# Patient Record
Sex: Female | Born: 1990 | Race: Black or African American | Hispanic: No | Marital: Single | Smoking: Never smoker
Health system: Southern US, Community
[De-identification: ages and names within clinical notes are randomized; demographics above are authoritative.]

---

## 2015-01-23 ENCOUNTER — Emergency Department (HOSPITAL_COMMUNITY): Payer: Federal, State, Local not specified - PPO

## 2015-01-23 ENCOUNTER — Emergency Department (HOSPITAL_COMMUNITY)
Admission: EM | Admit: 2015-01-23 | Discharge: 2015-01-23 | Disposition: A | Discharge status: Home / Self Care | Payer: Federal, State, Local not specified - PPO | Attending: Emergency Medicine | Admitting: Emergency Medicine

## 2015-01-23 ENCOUNTER — Encounter (HOSPITAL_COMMUNITY): Payer: Self-pay | Admitting: Emergency Medicine

## 2015-01-23 DIAGNOSIS — Z880 Allergy status to penicillin: Secondary | ICD-10-CM | POA: Diagnosis not present

## 2015-01-23 DIAGNOSIS — Y9389 Activity, other specified: Secondary | ICD-10-CM | POA: Diagnosis not present

## 2015-01-23 DIAGNOSIS — Y9289 Other specified places as the place of occurrence of the external cause: Secondary | ICD-10-CM | POA: Diagnosis not present

## 2015-01-23 DIAGNOSIS — G8929 Other chronic pain: Secondary | ICD-10-CM | POA: Insufficient documentation

## 2015-01-23 DIAGNOSIS — S9031XA Contusion of right foot, initial encounter: Secondary | ICD-10-CM | POA: Diagnosis not present

## 2015-01-23 DIAGNOSIS — Y998 Other external cause status: Secondary | ICD-10-CM | POA: Diagnosis not present

## 2015-01-23 DIAGNOSIS — S99921A Unspecified injury of right foot, initial encounter: Secondary | ICD-10-CM | POA: Diagnosis present

## 2015-01-23 DIAGNOSIS — W2209XA Striking against other stationary object, initial encounter: Secondary | ICD-10-CM | POA: Diagnosis not present

## 2015-01-23 MED ORDER — NAPROXEN 250 MG PO TABS
500.0000 mg | ORAL_TABLET | Freq: Once | ORAL | Status: AC
  Administered 2015-01-23: 500 mg via ORAL
  Filled 2015-01-23: qty 2

## 2015-01-23 NOTE — Discharge Instructions (Signed)
Rest, Ice intermittently (in the first 24-48 hours), and elevate (Limb above the level of the heart)   You can take your standard dose of naproxen or alternatively you can take up to  of ibuprofen (that is usually 4 over the counter pills)  3 times a day for 5 days. Take with food.    Do not hesitate to return to the emergency room for any new, worsening or concerning symptoms.  Please obtain primary care using resource guide below. Let them know that you were seen in the emergency room and that they will need to obtain records for further outpatient management.   Contusion A contusion is a deep bruise. Contusions are the result of an injury that caused bleeding under the skin. The contusion may turn blue, purple, or yellow. Minor injuries will give you a painless contusion, but more severe contusions may stay painful and swollen for a few weeks.  CAUSES  A contusion is usually caused by a blow, trauma, or direct force to an area of the body. SYMPTOMS   Swelling and redness of the injured area.  Bruising of the injured area.  Tenderness and soreness of the injured area.  Pain. DIAGNOSIS  The diagnosis can be made by taking a history and physical exam. An X-ray, CT scan, or MRI may be needed to determine if there were any associated injuries, such as fractures. TREATMENT  Specific treatment will depend on what area of the body was injured. In general, the best treatment for a contusion is resting, icing, elevating, and applying cold compresses to the injured area. Over-the-counter medicines may also be recommended for pain control. Ask your caregiver what the best treatment is for your contusion. HOME CARE INSTRUCTIONS   Put ice on the injured area.  Put ice in a plastic bag.  Place a towel between your skin and the bag.  Leave the ice on for 15-20 minutes, 3-4 times a day, or as directed by your health care provider.  Only take over-the-counter or prescription medicines for  pain, discomfort, or fever as directed by your caregiver. Your caregiver may recommend avoiding anti-inflammatory medicines (aspirin, ibuprofen, and naproxen) for 48 hours because these medicines may increase bruising.  Rest the injured area.  If possible, elevate the injured area to reduce swelling. SEEK IMMEDIATE MEDICAL CARE IF:   You have increased bruising or swelling.  You have pain that is getting worse.  Your swelling or pain is not relieved with medicines. MAKE SURE YOU:   Understand these instructions.  Will watch your condition.  Will get help right away if you are not doing well or get worse. Document Released: 02/25/2005 Document Revised: 05/23/2013 Document Reviewed: 03/23/2011 [facility] Patient Information 2015 [place], [place]. This information is not intended to replace advice given to you by your health care provider. Make sure you discuss any questions you have with your health care provider.   Emergency Department Resource Guide 1) Find a Doctor and Pay Out of Pocket Although you won't have to find out who is covered by your insurance plan, it is a good idea to ask around and get recommendations. You will then need to call the office and see if the doctor you have chosen will accept you as a new patient and what types of options they offer for patients who are self-pay. Some doctors offer discounts or will set up payment plans for their patients who do not have insurance, but you will need to ask so you aren't surprised  when you get to your appointment.  2) Contact Your Local Health Department Not all health departments have doctors that can see patients for sick visits, but many do, so it is worth a call to see if yours does. If you don't know where your local health department is, you can check in your phone book. The CDC also has a tool to help you locate your state's health department, and many state websites also have listings of all of their local health  departments.  3) Find a Walk-in Clinic If your illness is not likely to be very severe or complicated, you may want to try a walk in clinic. These are popping up all over the country in pharmacies, drugstores, and shopping centers. They're usually staffed by nurse practitioners or physician assistants that have been trained to treat common illnesses and complaints. They're usually fairly quick and inexpensive. However, if you have serious medical issues or chronic medical problems, these are probably not your best option.  No Primary Care Doctor: - Call Health Connect at  [phone] - they can help you locate a primary care doctor that  accepts your insurance, provides certain services, etc. - Physician Referral Service- [phone]  Chronic Pain Problems: Organization         Address  Phone   Notes  [facility]  [phone] Patients need to be referred by their primary care doctor.   Medication Assistance: Organization         Address  Phone   Notes  [facility] Medication [facility] [address] [place], [place] [address] [phone] --Must be a resident of [facility] [place] -- Must have NO insurance coverage whatsoever (no Medicaid/ Medicare, etc.) -- The pt. MUST have a primary care doctor that directs their care regularly and follows them in the community   MedAssist  [phone]   [employer]  [phone]    Agencies that provide inexpensive medical care: Organization         Address  Phone   Notes  [facility]  [phone]   [facility]    [phone]   [facility] [address] [place], [place] [address] [phone]   [facility] [address], [place] [phone]   Planned Parenthood    [phone]   [facility]    [phone]   [facility]  [address], [place] Phone:  [phone])  [phone], Fax:  [phone] Hours of Operation:  9 am - 6 pm, M-F.  Also accepts Medicaid/Medicare and self-pay.  [facility]  [address], [place] Phone: [phone], Fax: [phone]. Hours of Operation:  8:30 am - 5:30 pm, M-F.  Also accepts Medicaid and self-pay.  [facility] [place] [address], [place] Phone: [phone]   [facility] [address], [place], [place] [phone], Ext. [phone] Mondays & Thursdays: 7-9 AM.  First 15 patients are seen on a first come, first serve basis.    Medicaid-accepting [facility] Providers:  Organization         Address  Phone   Notes  [facility] [address],  [phone] Also accepts self-pay patients.  [facility] [address], [place]  [phone]   [facility] [address] [place], [address], [place] [phone]   [facility] [address], [place] [phone]   Renaye Rakers [address], [place]   [phone] Only accepts [place] Access [place] patients after they have their name applied to their card.   Self-Pay (no insurance) in [facility]:  Organization         Address  Phone   Notes  Sickle Cell Patients, [facility] [address] [place], [place] [phone]   [facility] [address] [place], [place] [phone]   [facility] [place]  [address], [place] [phone]   [facility]/Dr. Osei-Bonsu  [address], [place] or [address], [place] [phone] Phone number for both [place] and [place] locations is the same.  [facility] [address], [place] [phone]   [facility] [address], [place] or [address] [phone]   [facility] [address], [place] [phone], phone; [phone], fax Sees patients 1st and 3rd Saturday of every month.  Must not qualify for public or private insurance (i.e. Medicaid, Medicare, [place] Health Choice, Veterans' Benefits)  Household income should be no more than 200% of the poverty level The clinic cannot treat you if you are pregnant or think you are pregnant  Sexually transmitted diseases are not treated at the clinic.    Dental Care: Organization         Address  Phone  Notes  [facility] of [facility] [address] [place], [place] [phone] Accepts children up to age 62 who are enrolled in [place] or [place] Health Choice; pregnant women with a Medicaid card; and children who have applied for Medicaid or [place] Health Choice, but were declined, whose parents can pay a reduced fee at time of service.  [facility]  [address], [place] [phone] Accepts children up to age 4 who are enrolled in [place] or [place] Health Choice; pregnant women with a Medicaid card; and children who have applied for Medicaid or [place] Health Choice, but were declined, whose parents can pay a reduced fee at time of service.  [facility]  [address] [place], [place] [phone] Patients are seen by appointment only. Walk-ins are not accepted. [facility] will see patients 34 years of age and older. Monday - Tuesday (8am-5pm) Most Wednesdays (8:30-5pm) $30 per visit, cash only  [facility] Dental Access PRO[facility]  [address], [facility] [phone] Patients are seen by appointment only. Walk-ins are not accepted. [facility] will see patients 34 years of age and older. One Wednesday Evening (Monthly: Volunteer Based).  $30 per visit, cash only  [employer]  [phone] for adults;  Children under age 35, call Graduate Pediatric Dentistry at [phone]. Children aged 57-14, please call [phone] to request a pediatric application.  Dental services are provided in all areas of dental care including fillings, crowns and bridges, complete and partial dentures, implants, gum treatment, root canals, and extractions. Preventive care is also provided. Treatment is provided to both adults and children. Patients are selected via a lottery and there is often a waiting list.  [facility] [address] [place]  [phone] [email or web address]   [facility] [address] [place], [place] [phone], Ext. [phone] Second and Fourth Thursday of each month, opens at 6:30 AM; Clinic ends at 9 AM.  Patients are seen on a first-come first-served basis, and a limited number are seen during each clinic.   [facility]  [address] Hillard Danker [place], [place] [phone]   Eligibility Requirements You must have lived in [place], [place], or [place] counties for at least the last three months.   You cannot be eligible for state or federal sponsored [employer], including [employer], [place], or [employer].   You generally cannot be eligible for healthcare insurance through your employer.    How to apply: Eligibility screenings are held every Tuesday and Wednesday afternoon from 1:00 pm until 4:00 pm. You do not need an appointment for the interview!  [facility] [address], [place], [place]   [place]  [place] Department  Deputy  [phone]    Behavioral Health Resources in the Community: Intensive Outpatient Programs Organization         Address  Phone  Notes  Webb Dickens. [address] [place], [place] [phone]   [facility] Outpatient [address] [place], [place]   [facility]: [facility] [address] [place], [place]   [place] [address],  [place], [place] or [phone]   Substance Abuse Resources Organization         Address  Phone  Notes  Alcohol and Drug Services  [phone]   [place]  [phone]   The [place]   Chinita Pester  [phone]   Residential & Outpatient Substance Abuse Program  [phone]   Psychological Services Organization         Address  Phone  Notes  [facility]  [place]  [phone]   [place] [address], [place] or [phone]    Mobile Crisis Teams Organization         Address  Phone  Notes  [facility], Mobile Crisis Care Unit  [phone]   [facility]  [address] [place], [place]   [place] Levels [address], [place] [phone]    Self-Help/Support Groups Organization         Address  Phone             Notes  [place]. of [place] - variety of support groups  [place] Call for more information  Narcotics Anonymous (NA), [facility]aring [facility] [address], [employer] [place]  2 meetings at this location   Special educational needs teacher         Address  Phone  Notes  [facility]idential [facility] [place],    [place]  [phone]   [facility]  [address] [place] [address], [place], [place]   [place], [place] [phone] Admissions: 8am-3pm M-F  Incentives Substance [place] [address]    [place], [place] [phone]   [facility] [address] [place], [place], [place]   The [address]  [place], [place]   [facility] - Intensive  Outpatient [address], [place], [place]   [facility] ([place] [address]  [place], [place] [phone] or [phone]   [facility] ([facility]) [address], [place], [place] Accepts Medicaid  [facility] [place] [address],  [place] [phone] Substance Abuse/Addiction Treatment   [facility] Organization         Address  Phone  Notes  [facility]  [phone]   Domenic Schwab, PhD [address] Arlis Porta [place], [place]   [phone] or [phone]   [place], [place] [phone]   [facility] [address] [place], [place] [phone] Insurance/Medicaid/sponsorship through [facility] and Families [address]                                    [place], [place] [phone] [place] [address], [place] [phone]    Dr. Adele Schilder  [phone]   [facility] Dept. 1) [address], [place] 2) [place] 3)  [address], [place] [phone]  [phone]   [place] [phone] or [phone] (After Hours)

## 2015-01-23 NOTE — ED Notes (Signed)
Patient states stepped on the prongs of an adapter last night and bruised bottom of foot.   Patient states that she is unable to put a lot of weight on the foot.   R foot pain.  No break in skin.

## 2015-01-23 NOTE — ED Notes (Signed)
Patient remains in xray 

## 2015-01-23 NOTE — ED Provider Notes (Signed)
CSN: [id number]     Arrival date & time 01/23/15  4098 History   This patient was seen in room TR11C/TR11C and the patient's care was started at 10:39 AM.    Chief Complaint  Patient presents with  . Foot Injury     The history is provided by the patient. No language interpreter was used.    Blood pressure 103/62, pulse 71, temperature 98.4 F (36.9 C), temperature source Oral, resp. rate 12, height 5\' 1"  (1.549 m), weight 120 lb (54.432 kg), SpO2 100 %.  Annette Roy is a 24 y.o. female complaining of pain to the sole of the right foot. Patient states that she stepped on a electrical adapter last night. States pain is significantly exacerbated with weightbearing and she's having difficulty ambulating. No pain medication taken prior to arrival. States that she normally takes naproxen 500 for chronic headaches.  History reviewed. No pertinent past medical history. History reviewed. No pertinent past surgical history. No family history on file. Social History  Substance Use Topics  . Smoking status: Never Smoker   . Smokeless tobacco: None  . Alcohol Use: No   OB History    No data available     Review of Systems  10 systems reviewed and found to be negative, except as noted in the HPI.  Allergies  Penicillins  Home Medications   Prior to Admission medications   Not on File   BP 103/62 mmHg  Pulse 71  Temp(Src) 98.4 F (36.9 C) (Oral)  Resp 12  Ht 5\' 1"  (1.549 m)  Wt 120 lb (54.432 kg)  BMI 22.69 kg/m2  SpO2 100%  LMP 01/01/2015 Physical Exam  Constitutional: She is oriented to person, place, and time. She appears well-developed and well-nourished. No distress.  HENT:  Head: Normocephalic and atraumatic.  Eyes: Conjunctivae are normal.  Neck: Normal range of motion.  Cardiovascular: Normal rate.   Pulmonary/Chest: Effort normal.  Musculoskeletal: Normal range of motion. She exhibits tenderness.  Nascent ecchymoses as diagrammed. No break in the integrity  of the skin, mild tenderness to palpation. Patient is neurovascularly intact.  Neurological: She is alert and oriented to person, place, and time.  Skin: Skin is warm and dry.  Psychiatric: She has a normal mood and affect. Her behavior is normal.  Nursing note and vitals reviewed.   ED Course  Procedures   DIAGNOSTIC STUDIES:  Oxygen Saturation is 100% on room air, normal by my interpretation.    COORDINATION OF CARE:  10:39 AM Discussed treatment plan with pt at bedside and pt agreed to plan.  Labs Review Labs Reviewed - No data to display  Imaging Review Dg Foot Complete Right  01/23/2015   CLINICAL DATA:  Injury.  Pain .  EXAM: RIGHT FOOT COMPLETE - 3+ VIEW  COMPARISON:  None.  FINDINGS: No acute bony or joint abnormality identified. No evidence of fracture or dislocation.  IMPRESSION: No acute or focal abnormality.   Electronically Signed   By: Maisie Fus  Register   On: 01/23/2015 10:34   I have personally reviewed and evaluated these images and lab results as part of my medical decision-making.   EKG Interpretation None      MDM   Final diagnoses:  Foot contusion, right, initial encounter    Filed Vitals:   01/23/15 0954  BP: 103/62  Pulse: 71  Temp: 98.4 F (36.9 C)  TempSrc: Oral  Resp: 12  Height: 5\' 1"  (1.549 m)  Weight: 120 lb (54.432 kg)  SpO2:  100%    Medications  naproxen (NAPROSYN) tablet 500 mg (not administered)    Annette Roy is a pleasant 24 y.o. female presenting with pain and bruising to the sole of the right foot, she stepped on a electrical adapter last night. Triage initiated x-ray negative. Patient is given crutches and recommended rest, ice, compression and elevation.  Evaluation does not show pathology that would require ongoing emergent intervention or inpatient treatment. Pt is hemodynamically stable and mentating appropriately. Discussed findings and plan with patient/guardian, who agrees with care plan. All questions answered.  Return precautions discussed and outpatient follow up given.      Wynetta Emery, PA-C 01/23/15 1039  Cathren Laine, MD 01/23/15 [phone]

## 2016-06-02 IMAGING — CR DG FOOT COMPLETE 3+V*R*
3 series · 3 of 3 positions shown · non-contrast
Comparison: None.

CLINICAL DATA: Injury.  Pain .

EXAM:
RIGHT FOOT COMPLETE - 3+ VIEW

[foot ap]
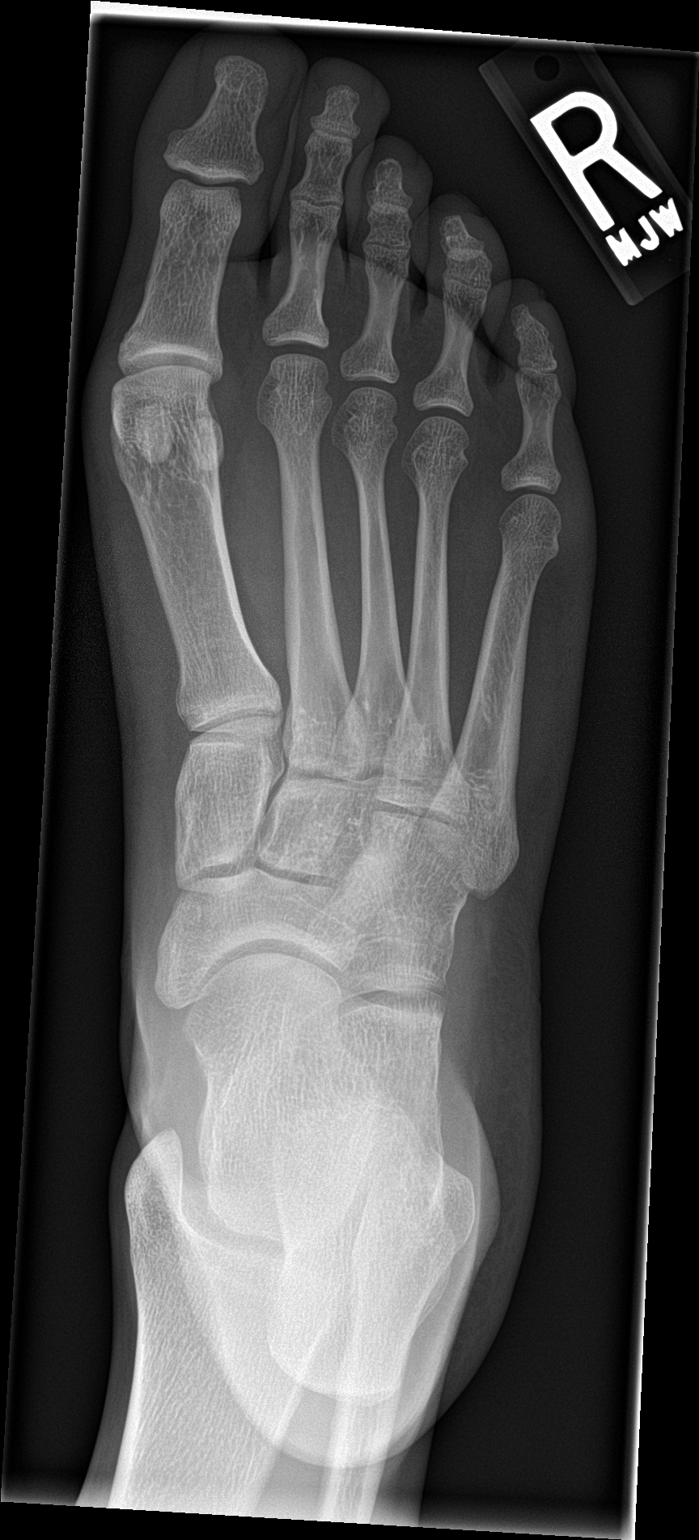

[foot obl]
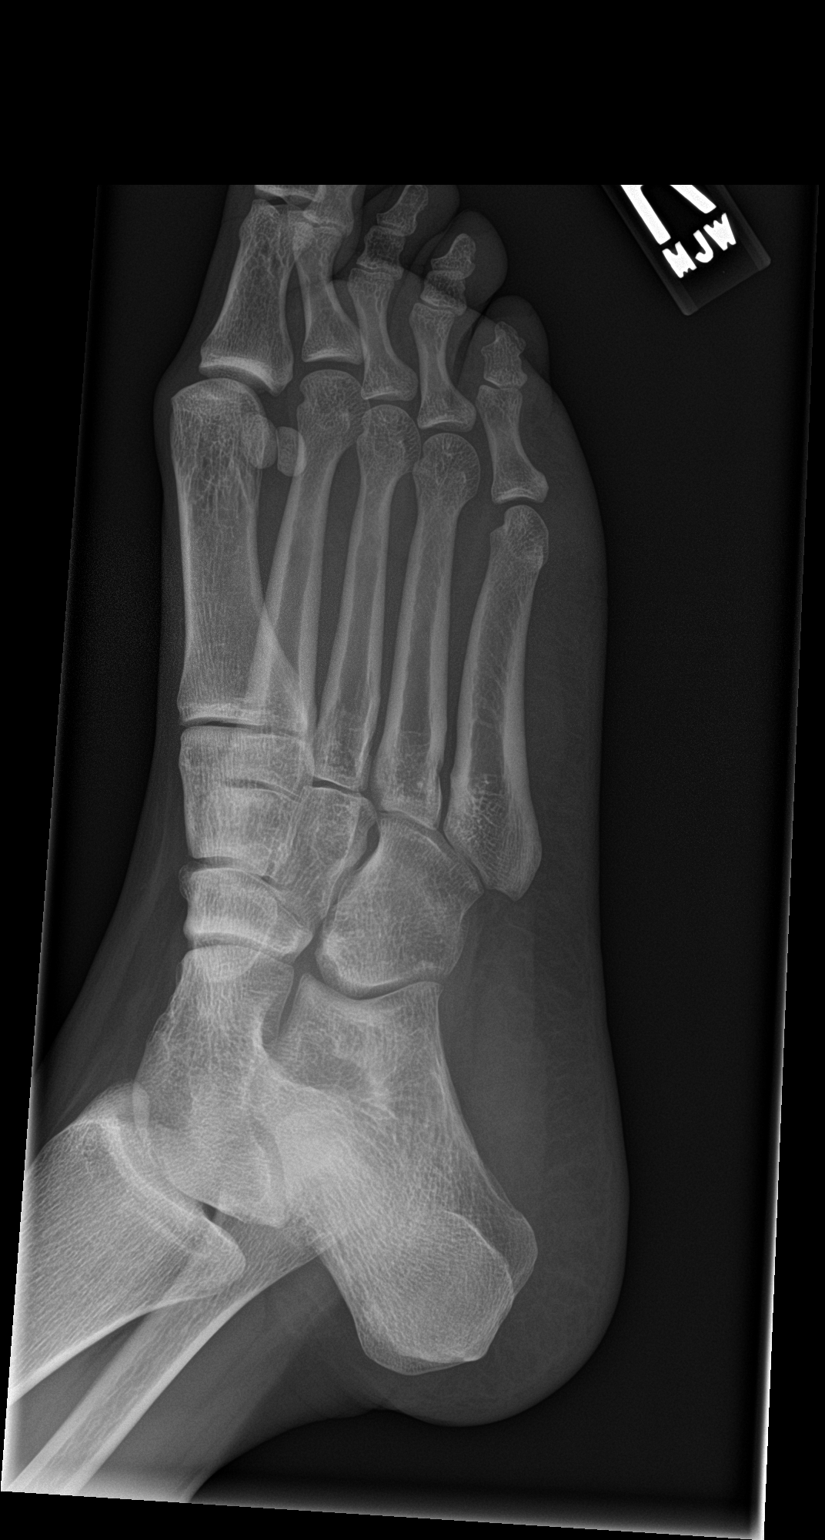

[foot lat]
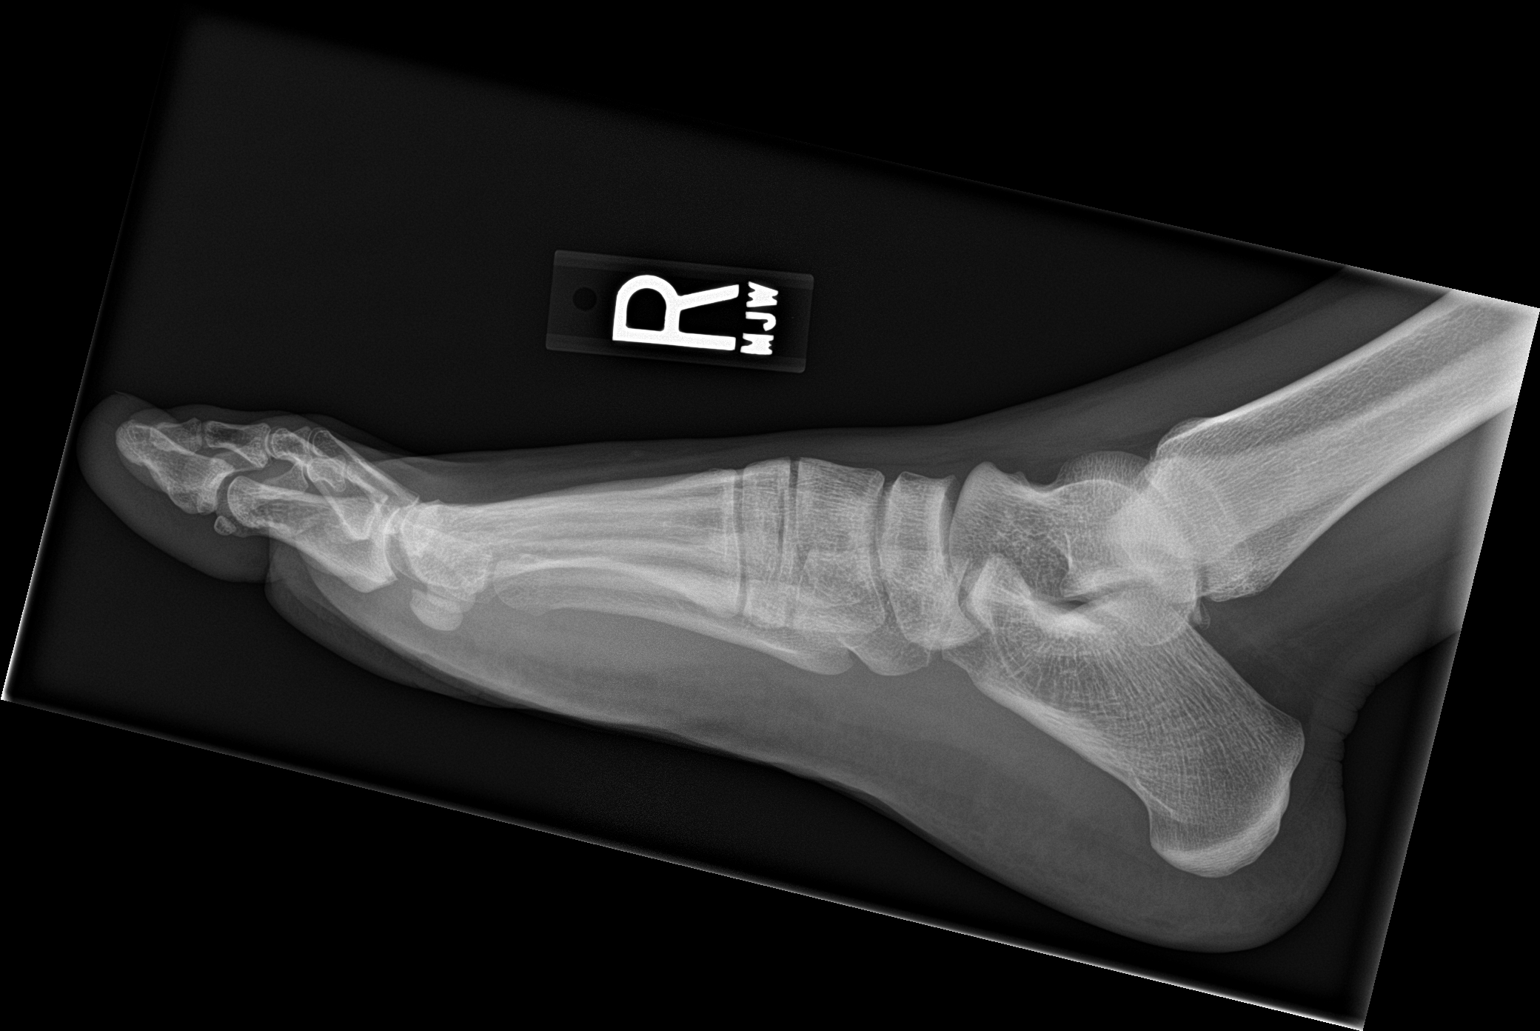

[3 of 3 positions shown; findings below may reference images not displayed]

FINDINGS: No acute bony or joint abnormality identified. No evidence of
fracture or dislocation.
IMPRESSION: No acute or focal abnormality.
